# Patient Record
Sex: Female | Born: 2000 | Race: Black or African American | Hispanic: No | Marital: Single | Smoking: Never smoker
Health system: Southern US, Community
[De-identification: ages and names within clinical notes are randomized; demographics above are authoritative.]

---
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
## 2022-06-30 ENCOUNTER — Encounter (INDEPENDENT_AMBULATORY_CARE_PROVIDER_SITE_OTHER): Payer: Medicaid Other | Admitting: Primary Care

## 2022-09-03 ENCOUNTER — Other Ambulatory Visit (HOSPITAL_COMMUNITY)
Admission: RE | Admit: 2022-09-03 | Discharge: 2022-09-03 | Disposition: A | Discharge status: Home / Self Care | Payer: Medicaid Other | Source: Ambulatory Visit | Attending: Primary Care | Admitting: Primary Care

## 2022-09-03 ENCOUNTER — Ambulatory Visit (INDEPENDENT_AMBULATORY_CARE_PROVIDER_SITE_OTHER): Payer: Medicaid Other

## 2022-09-03 DIAGNOSIS — N898 Other specified noninflammatory disorders of vagina: Secondary | ICD-10-CM

## 2022-09-07 ENCOUNTER — Other Ambulatory Visit (INDEPENDENT_AMBULATORY_CARE_PROVIDER_SITE_OTHER): Payer: Self-pay | Admitting: Primary Care

## 2022-09-07 LAB — CERVICOVAGINAL ANCILLARY ONLY
Bacterial Vaginitis (gardnerella): POSITIVE — AB
Candida Glabrata: NEGATIVE
Candida Vaginitis: POSITIVE — AB
Chlamydia: NEGATIVE
Comment: NEGATIVE
Comment: NEGATIVE
Comment: NEGATIVE
Comment: NEGATIVE
Comment: NEGATIVE
Comment: NORMAL
Neisseria Gonorrhea: NEGATIVE
Trichomonas: NEGATIVE

## 2022-09-07 MED ORDER — METRONIDAZOLE 500 MG PO TABS: 500.0000 mg | ORAL_TABLET | Freq: Two times a day (BID) | ORAL | 0 refills | Status: AC

## 2022-09-07 MED ORDER — FLUCONAZOLE 150 MG PO TABS: 150.0000 mg | ORAL_TABLET | Freq: Every day | ORAL | 1 refills | Status: AC

## 2022-11-11 IMAGING — CR DG CHEST 2V
2 series · 2 of 2 positions shown · non-contrast
Comparison: None.

CLINICAL DATA: 20-year-old female with shortness of breath.

EXAM:
CHEST - 2 VIEW

[w chest pa]
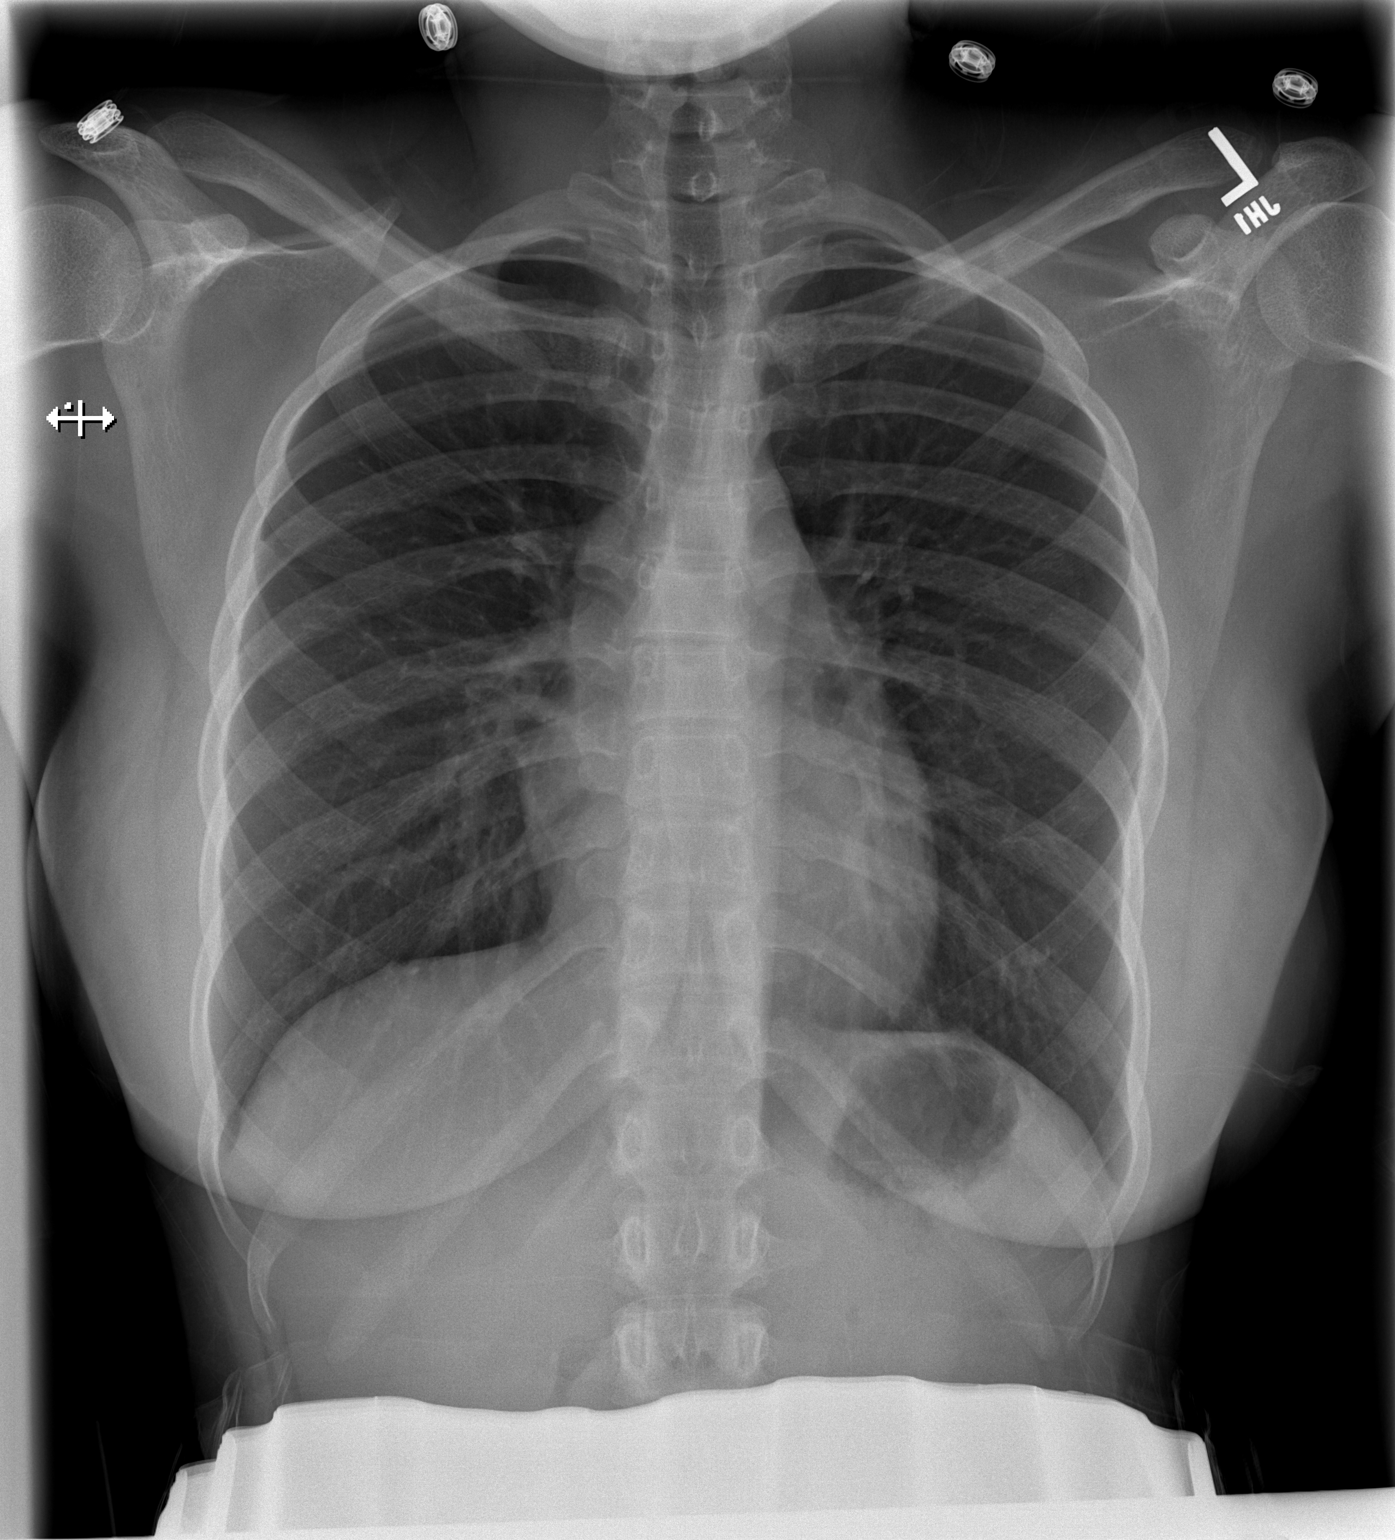

[w chest lat]
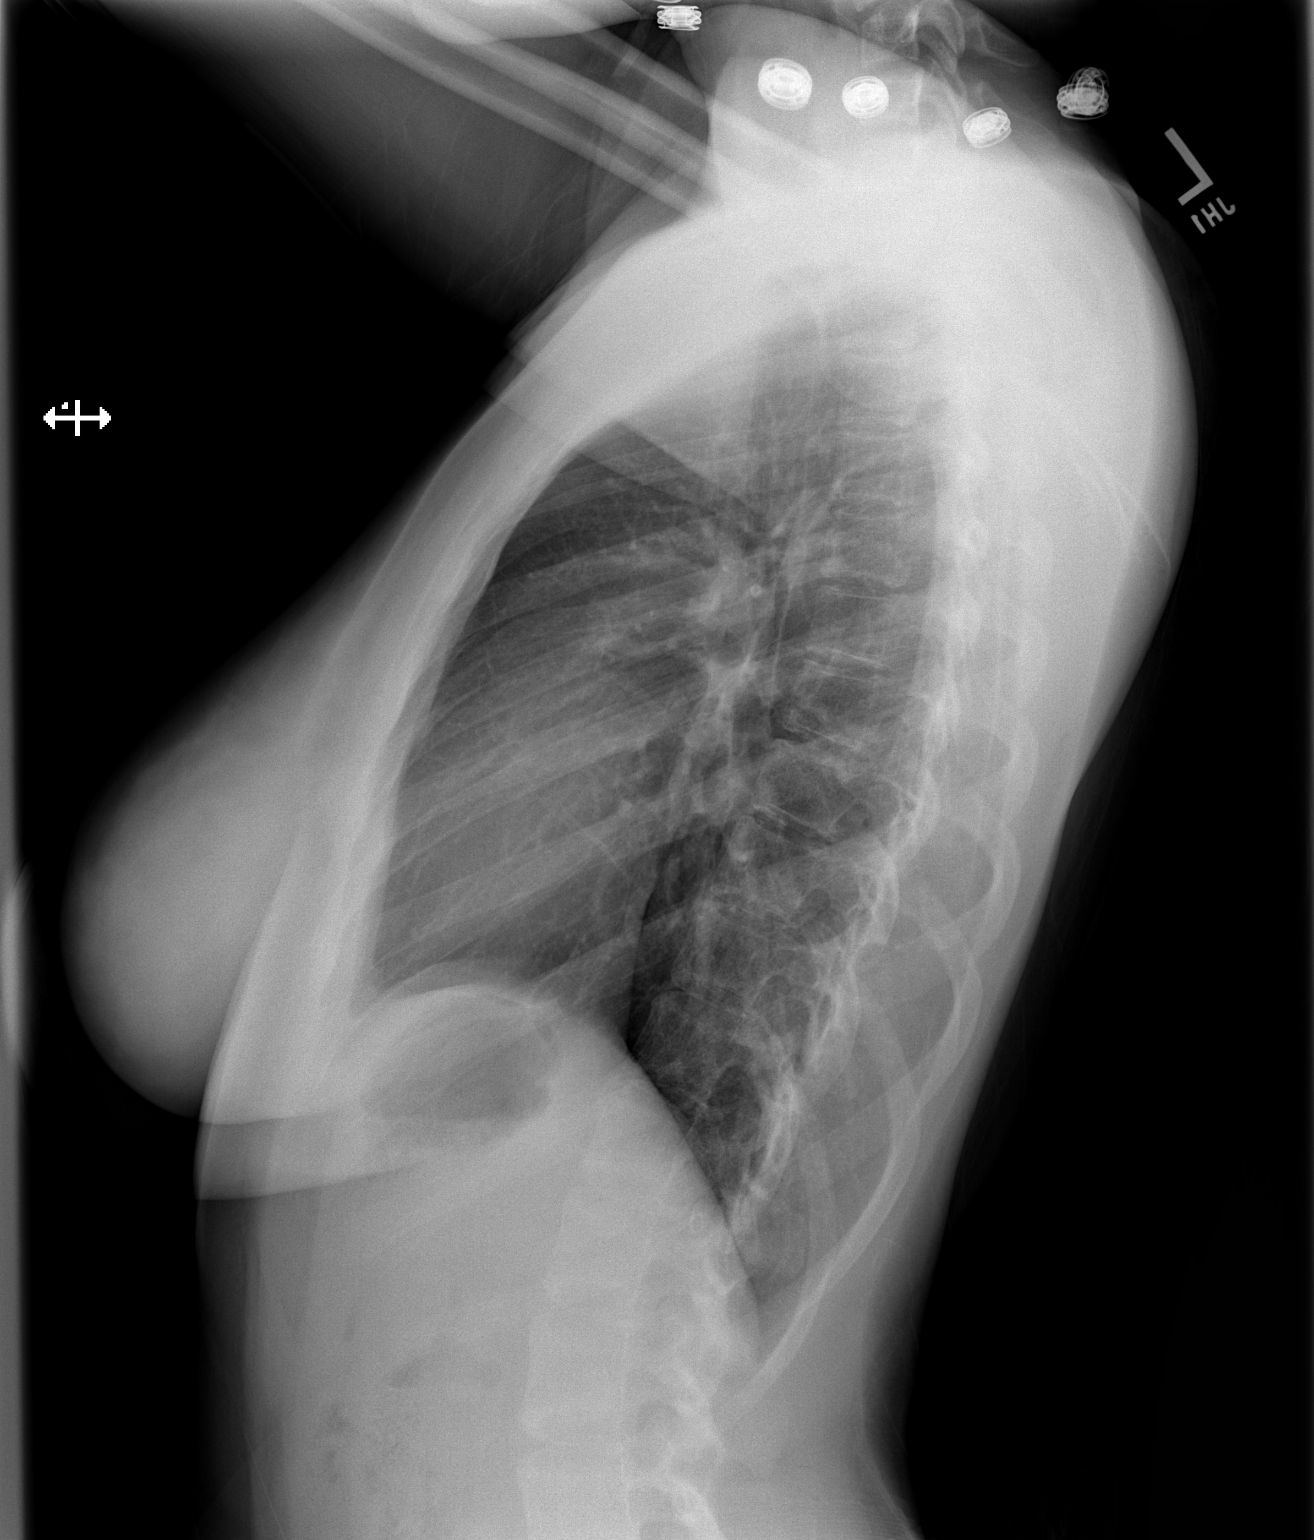

[2 of 2 positions shown; findings below may reference images not displayed]

FINDINGS: The heart size and mediastinal contours are within normal limits.
Both lungs are clear. The visualized skeletal structures are
unremarkable.
IMPRESSION: No active cardiopulmonary disease.

## 2023-02-15 ENCOUNTER — Ambulatory Visit (INDEPENDENT_AMBULATORY_CARE_PROVIDER_SITE_OTHER): Payer: Medicaid Other | Admitting: Primary Care

## 2023-11-08 ENCOUNTER — Telehealth (INDEPENDENT_AMBULATORY_CARE_PROVIDER_SITE_OTHER): Payer: Self-pay | Admitting: Primary Care

## 2023-11-08 ENCOUNTER — Ambulatory Visit (INDEPENDENT_AMBULATORY_CARE_PROVIDER_SITE_OTHER): Admitting: Primary Care

## 2023-11-08 ENCOUNTER — Encounter (INDEPENDENT_AMBULATORY_CARE_PROVIDER_SITE_OTHER): Payer: Self-pay | Admitting: Primary Care

## 2023-11-08 VITALS — BP 106/67 | HR 70 | Resp 16 | Ht <= 58 in | Wt 111.8 lb

## 2023-11-08 DIAGNOSIS — E559 Vitamin D deficiency, unspecified: Secondary | ICD-10-CM

## 2023-11-08 DIAGNOSIS — N912 Amenorrhea, unspecified: Secondary | ICD-10-CM | POA: Diagnosis not present

## 2023-11-08 DIAGNOSIS — F411 Generalized anxiety disorder: Secondary | ICD-10-CM | POA: Diagnosis not present

## 2023-11-08 DIAGNOSIS — R4589 Other symptoms and signs involving emotional state: Secondary | ICD-10-CM

## 2023-11-08 NOTE — Progress Notes (Signed)
 [facility]  Kendra Moyer, is a 23 y.o. female  [id number]  ION:[id number]  DOB - Mar 25, 2001  Chief Complaint  Patient presents with   Fatigue   Bloated   Medication Management    Pt is requesting something to help with her scarring from her breast reduction back in 2022       Subjective:   Kendra Moyer is a 23 y.o. female here today for an acute visit.  She has been experiencing bloating and irregular menstruation she is on Nexplanon.  She also voices concerns with scarring/discoloration of breast after breast reduction surgery.  Questioning was there anything she can use for this.  No problems updated.  Comprehensive ROS Pertinent positive and negative noted in HPI   No Known Allergies  No past medical history on file.  Current Outpatient Medications on File Prior to Visit  Medication Sig Dispense Refill   fluconazole  (DIFLUCAN ) 150 MG tablet Take 1 tablet (150 mg total) by mouth daily. 1 tablet 1   metroNIDAZOLE  (FLAGYL ) 500 MG tablet Take 1 tablet (500 mg total) by mouth 2 (two) times daily. 14 tablet 0   No current facility-administered medications on file prior to visit.   Health Maintenance  Topic Date Due   HPV Vaccine (1 - 3-dose series) Never done   Meningitis B Vaccine (1 of 2 - Standard) Never done   DTaP/Tdap/Td vaccine (1 - Tdap) Never done   COVID-19 Vaccine (1 - 2024-25 season) Never done   Pap Smear  Never done   Chlamydia screening  09/03/2023   Flu Shot  12/31/2023   Hepatitis C Screening  Completed   HIV Screening  Completed    Objective:   Vitals:   11/08/23 1400  BP: 106/67  Pulse: 70  Resp: 16  SpO2: 100%  Weight: 111 lb 12.8 oz (50.7 kg)  Height: 4\' 8"  (1.422 m)   Physical Exam Physical exam: General: Vital signs reviewed.  Patient is well-developed and well-nourished,  in no acute distress and cooperative with exam. Head: Normocephalic and atraumatic. Eyes: EOMI, conjunctivae normal, no scleral  icterus. Neck: Supple, trachea midline, normal ROM, no JVD, masses, thyromegaly, or carotid bruit present. Cardiovascular: RRR, S1 normal, S2 normal, no murmurs, gallops, or rubs. Pulmonary/Chest: Clear to auscultation bilaterally, no wheezes, rales, or rhonchi. Abdominal: Soft, non-tender, non-distended, BS +, no masses, organomegaly, or guarding present. Musculoskeletal: No joint deformities, erythema, or stiffness, ROM full and nontender. Extremities: No lower extremity edema bilaterally,  pulses symmetric and intact bilaterally. No cyanosis or clubbing. Neurological: A&O x3, Strength is normal Skin: Warm, dry and intact. No rashes or erythema. Psychiatric: Normal mood and affect. speech and behavior is normal. Cognition and memory are normal.    Assessment & Plan  Kendra Moyer was seen today for fatigue, bloated and medication management.  Diagnoses and all orders for this visit:  Need for emotional support Letter written needed from PCP  Amenorrhea, unspecified -     CBC with Differential/Platelet; Future -     CMP14+EGFR; Future  Vitamin D deficiency -     VITAMIN D 25 Hydroxy (Vit-D Deficiency, Fractures); Future  Generalized anxiety disorder Followed outside of practice  Patient have been counseled extensively about nutrition and exercise. Other issues discussed during this visit include: low cholesterol diet, weight control and daily exercise, foot care, annual eye examinations at Ophthalmology, importance of adherence with medications and regular follow-up. We also discussed long term complications of uncontrolled diabetes and hypertension.   Pap 6 weeks  The patient was given clear instructions to go to ER or return to medical center if symptoms don't improve, worsen or new problems develop. The patient verbalized understanding. The patient was told to call to get lab results if they haven't heard anything in the next week.   This note has been created with Engineer, agricultural. Any transcriptional errors are unintentional.   Marius Siemens, NP 11/08/2023, 2:36 PM

## 2023-11-08 NOTE — Telephone Encounter (Signed)
 Called pt to remind them that the letter was ready for pickup.

## 2023-11-08 NOTE — Patient Instructions (Signed)
Etonogestrel Implant What is this medication? ETONOGESTREL (et oh noe JES trel) prevents ovulation and pregnancy. It belongs to a group of medications called contraceptives. This medication is a progestin hormone. This medicine may be used for other purposes; ask your health care provider or pharmacist if you have questions. COMMON BRAND NAME(S): Implanon, Nexplanon What should I tell my care team before I take this medication? They need to know if you have any of these conditions: Abnormal vaginal bleeding Blood clots Blood vessel disease Breast, cervical, endometrial, ovarian, liver, or uterine cancer Diabetes Gallbladder disease Heart disease or recent heart attack High blood pressure High cholesterol or triglycerides Kidney disease Liver disease Migraine headaches Seizures Stroke Tobacco use An unusual or allergic reaction to etonogestrel, other medications, foods, dyes, or preservatives Pregnant or trying to get pregnant Breastfeeding How should I use this medication? This device is inserted just under the skin on the inner side of your upper arm by your care team. Talk to your care team about the use of this medication in children. Special care may be needed. Overdosage: If you think you have taken too much of this medicine contact a poison control center or emergency room at once. NOTE: This medicine is only for you. Do not share this medicine with others. What if I miss a dose? This does not apply. What may interact with this medication? Do not take this medication with any of the following: Amprenavir Fosamprenavir This medication may also interact with the following: Acitretin Aprepitant Armodafinil Bexarotene Bosentan Carbamazepine Certain antivirals for HIV or hepatitis Certain medications for fungal infections, such as fluconazole, ketoconazole, itraconazole, or  voriconazole Cyclosporine Felbamate Griseofulvin Lamotrigine Modafinil Oxcarbazepine Phenobarbital Phenytoin Primidone Rifabutin Rifampin Rifapentine St. John's wort Topiramate This list may not describe all possible interactions. Give your health care provider a list of all the medicines, herbs, non-prescription drugs, or dietary supplements you use. Also tell them if you smoke, drink alcohol, or use illegal drugs. Some items may interact with your medicine. What should I watch for while using this medication? Visit your care team for regular checks on your progress. Using this medication does not protect you or your partner against HIV or other sexually transmitted infections (STIs). You should be able to feel the implant by pressing your fingertips over the skin where it was inserted. Contact your care team if you cannot feel the implant, and use a non-hormonal birth control method (such as condoms) until your care team confirms that the implant is in place. Contact your care team if you think that the implant may have broken or become bent while in your arm. You will receive a user card from your care team after the implant is inserted. The card is a record of the location of the implant in your upper arm and when it should be removed. Keep this card with your health records. What side effects may I notice from receiving this medication? Side effects that you should report to your care team as soon as possible: Allergic reactions--skin rash, itching, hives, swelling of the face, lips, tongue, or throat Blood clot--pain, swelling, or warmth in the leg, shortness of breath, chest pain Gallbladder problems--severe stomach pain, nausea, vomiting, fever Increase in blood pressure Liver injury--right upper belly pain, loss of appetite, nausea, light-colored stool, dark yellow or brown urine, yellowing skin or eyes, unusual weakness or fatigue New or worsening migraines or headaches Pain,  redness, or irritation at injection site Stroke--sudden numbness or weakness of the face, arm,  or leg, trouble speaking, confusion, trouble walking, loss of balance or coordination, dizziness, severe headache, change in vision Unusual vaginal discharge, itching, or odor Worsening mood, feelings of depression Side effects that usually do not require medical attention (report to your care team if they continue or are bothersome): Breast pain or tenderness Dark patches of skin on the face or other sun-exposed areas Irregular menstrual cycles or spotting Nausea Weight gain This list may not describe all possible side effects. Call your doctor for medical advice about side effects. You may report side effects to FDA at 1-800-FDA-1088. Where should I keep my medication? This medication is given in a hospital or clinic and will not be stored at home. NOTE: This sheet is a summary. It may not cover all possible information. If you have questions about this medicine, talk to your doctor, pharmacist, or health care provider.  2024 Elsevier/Gold Standard (2021-12-23 00:00:00)

## 2024-01-04 ENCOUNTER — Telehealth (INDEPENDENT_AMBULATORY_CARE_PROVIDER_SITE_OTHER): Payer: Self-pay | Admitting: Primary Care

## 2024-01-04 NOTE — Telephone Encounter (Signed)
 Called pt to schedule appt. Pt did not answer but can schedule appt if they call back.

## 2024-01-18 ENCOUNTER — Telehealth (INDEPENDENT_AMBULATORY_CARE_PROVIDER_SITE_OTHER): Payer: Self-pay | Admitting: Primary Care

## 2024-01-18 NOTE — Telephone Encounter (Signed)
 Called pt to confirm appt. Pt did not answer and did not LVM.

## 2024-01-19 ENCOUNTER — Ambulatory Visit (INDEPENDENT_AMBULATORY_CARE_PROVIDER_SITE_OTHER): Admitting: Primary Care
